# Patient Record
Sex: Male | Born: 1950 | Race: White | Hispanic: No | Marital: Married
Health system: Southern US, Community
[De-identification: ages and names within clinical notes are randomized; demographics above are authoritative.]

---

## 2008-01-26 ENCOUNTER — Encounter: Admission: RE | Admit: 2008-01-26 | Discharge: 2008-01-26 | Payer: Self-pay | Admitting: Orthopedic Surgery

## 2009-06-29 IMAGING — CR DG KNEE 1-2V*R*
2 series · 2 of 2 positions shown · non-contrast
Comparison: None.

CLINICAL DATA: Bilateral knee pain, right greater left for many
years.  No known injury.

RIGHT KNEE - 1-2 VIEW

[view not recorded (1 of 2)]
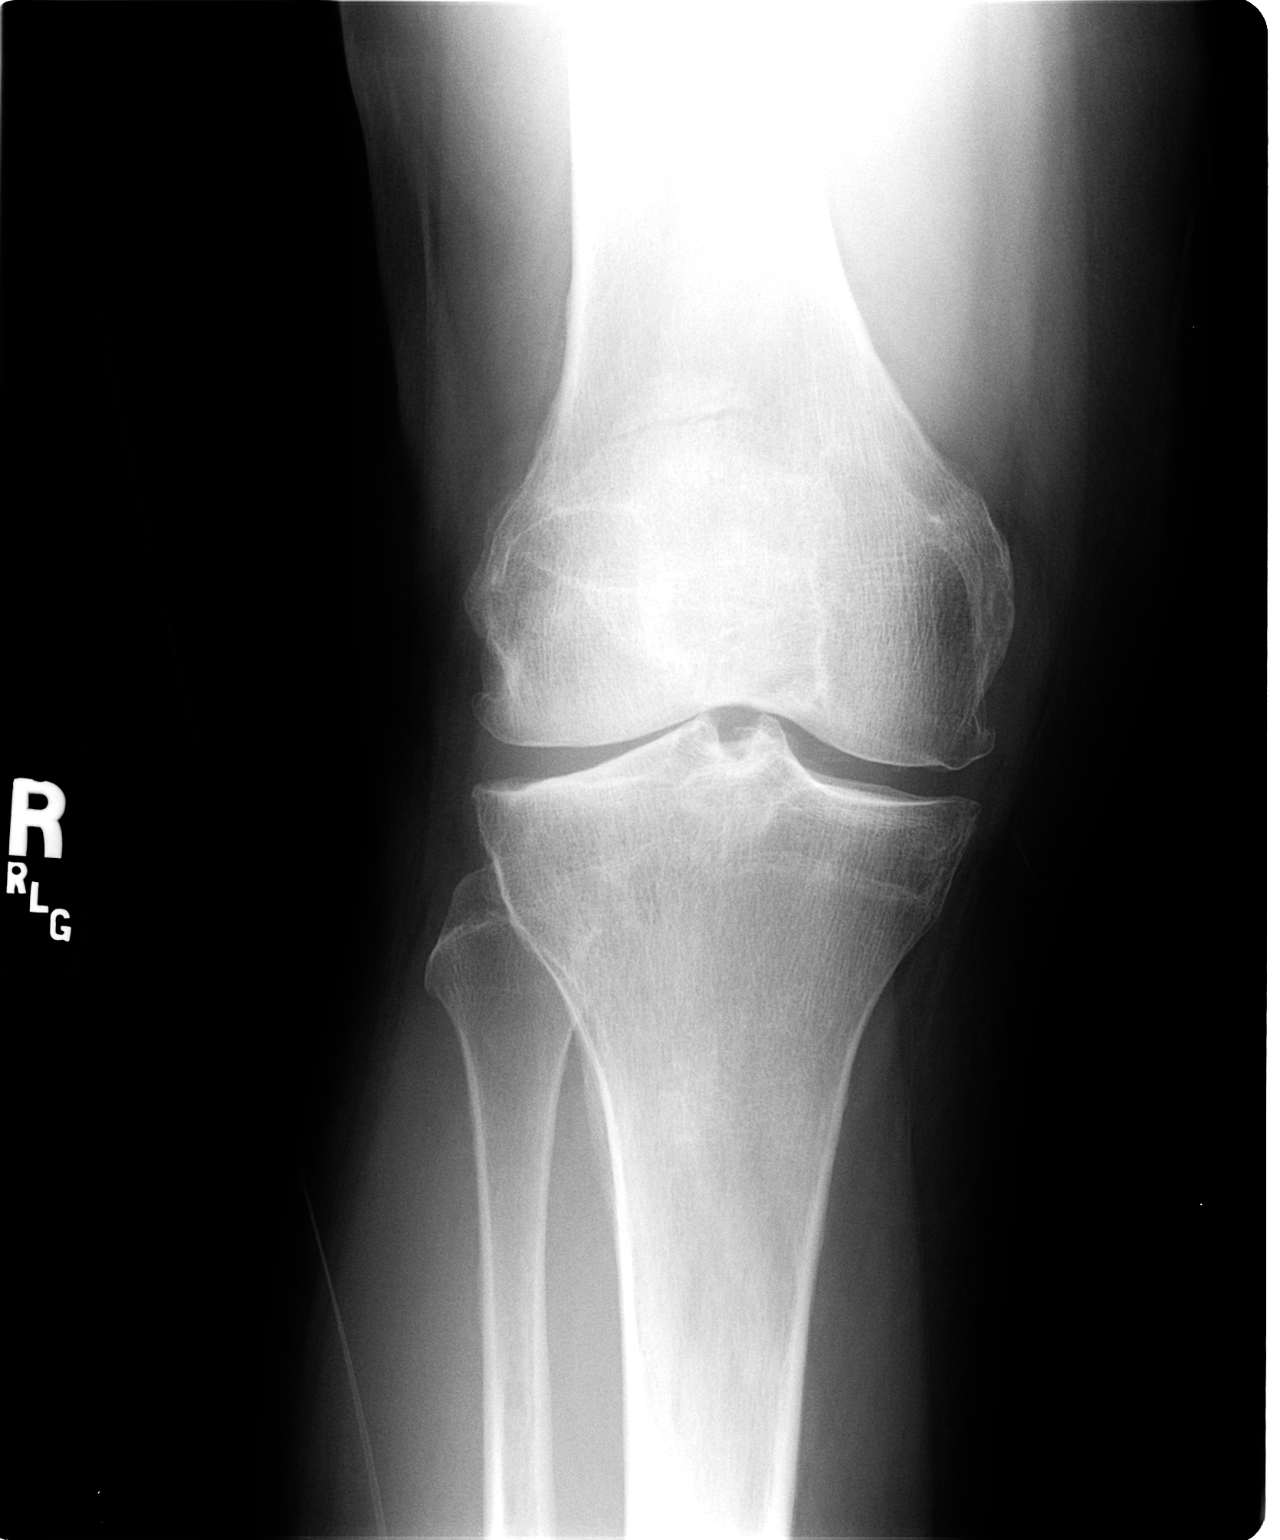

[view not recorded (2 of 2)]
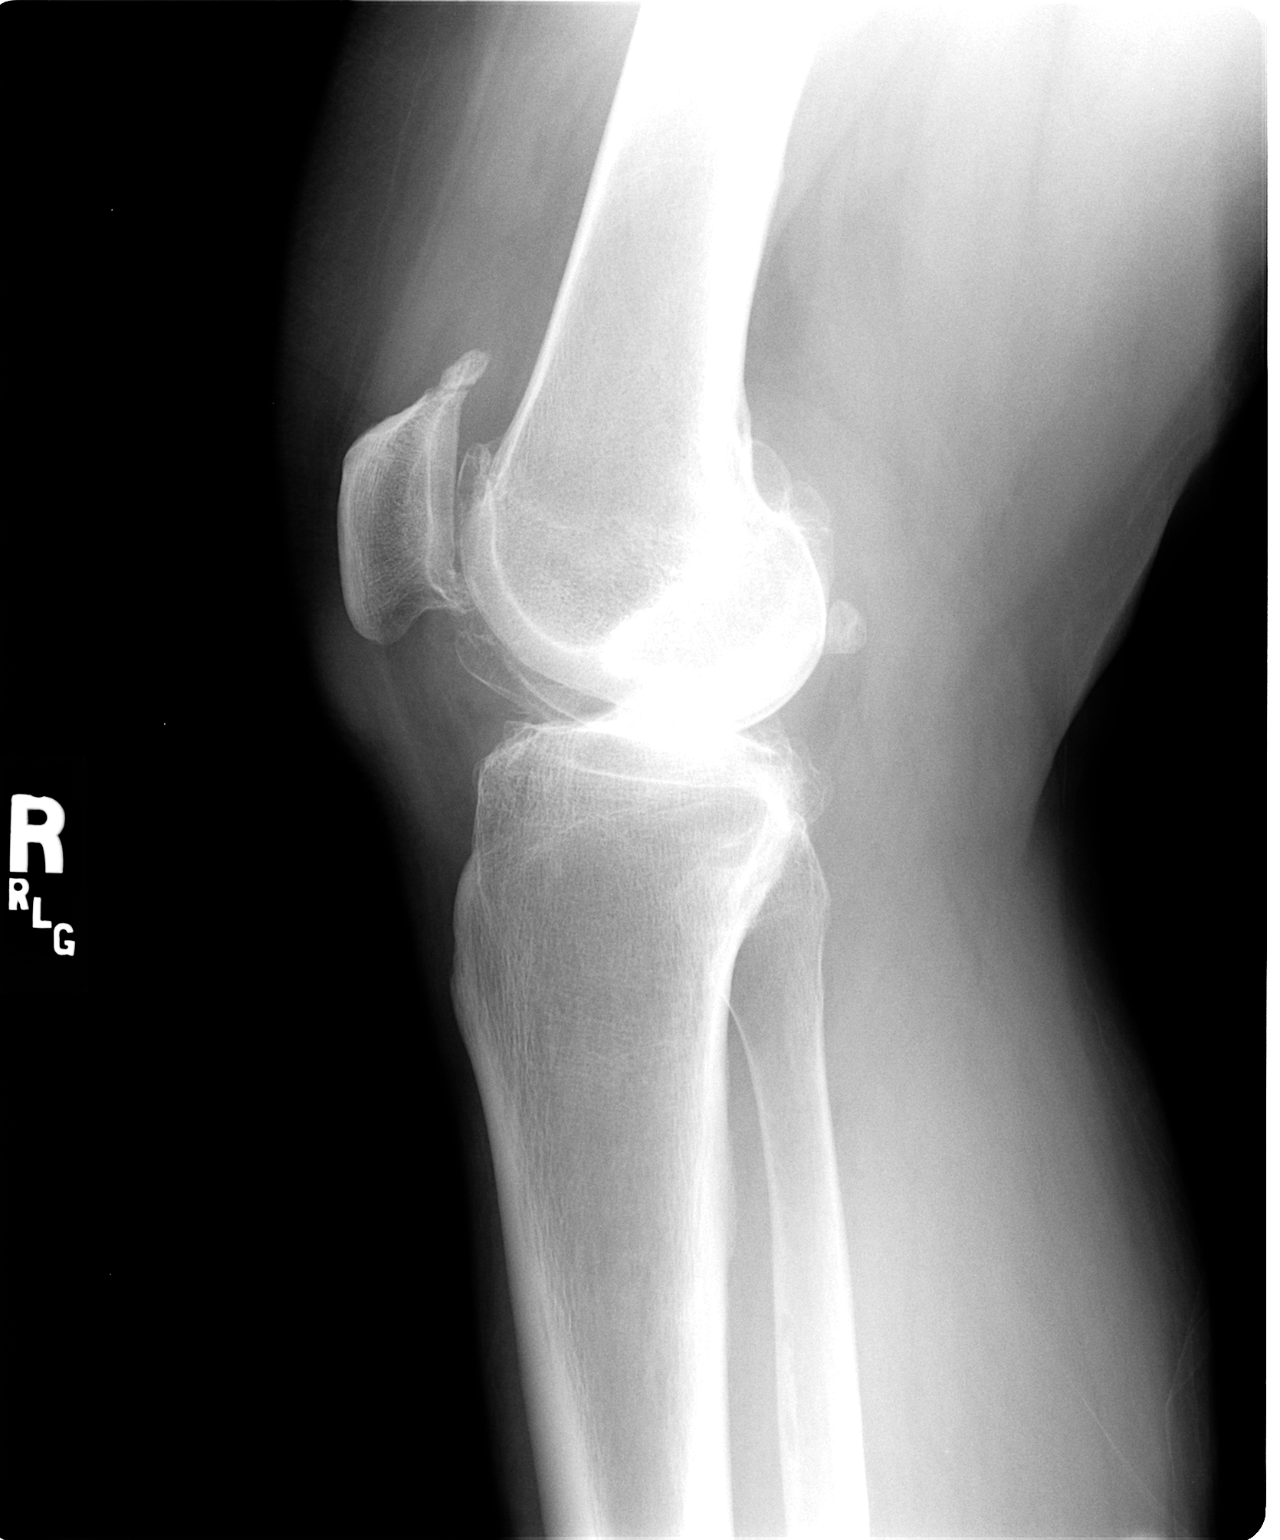

[2 of 2 positions shown; findings below may reference images not displayed]

FINDINGS: Moderate tricompartment degenerative changes.  Question
CPPD.  Osteophyte formation most notable patellofemoral joint
space.  Slight fragmentation of the superior patella osteophyte.
IMPRESSION: Moderate tricompartment degenerative changes.

Question CPPD.

## 2009-06-29 IMAGING — CR DG KNEE 1-2V*L*
2 series · 2 of 2 positions shown · non-contrast
Comparison: none

CLINICAL DATA: Right greater left knee pain for many years.  No
known injury.

LEFT KNEE - 1-2 VIEW
Comparison None.

[view not recorded (1 of 2)]
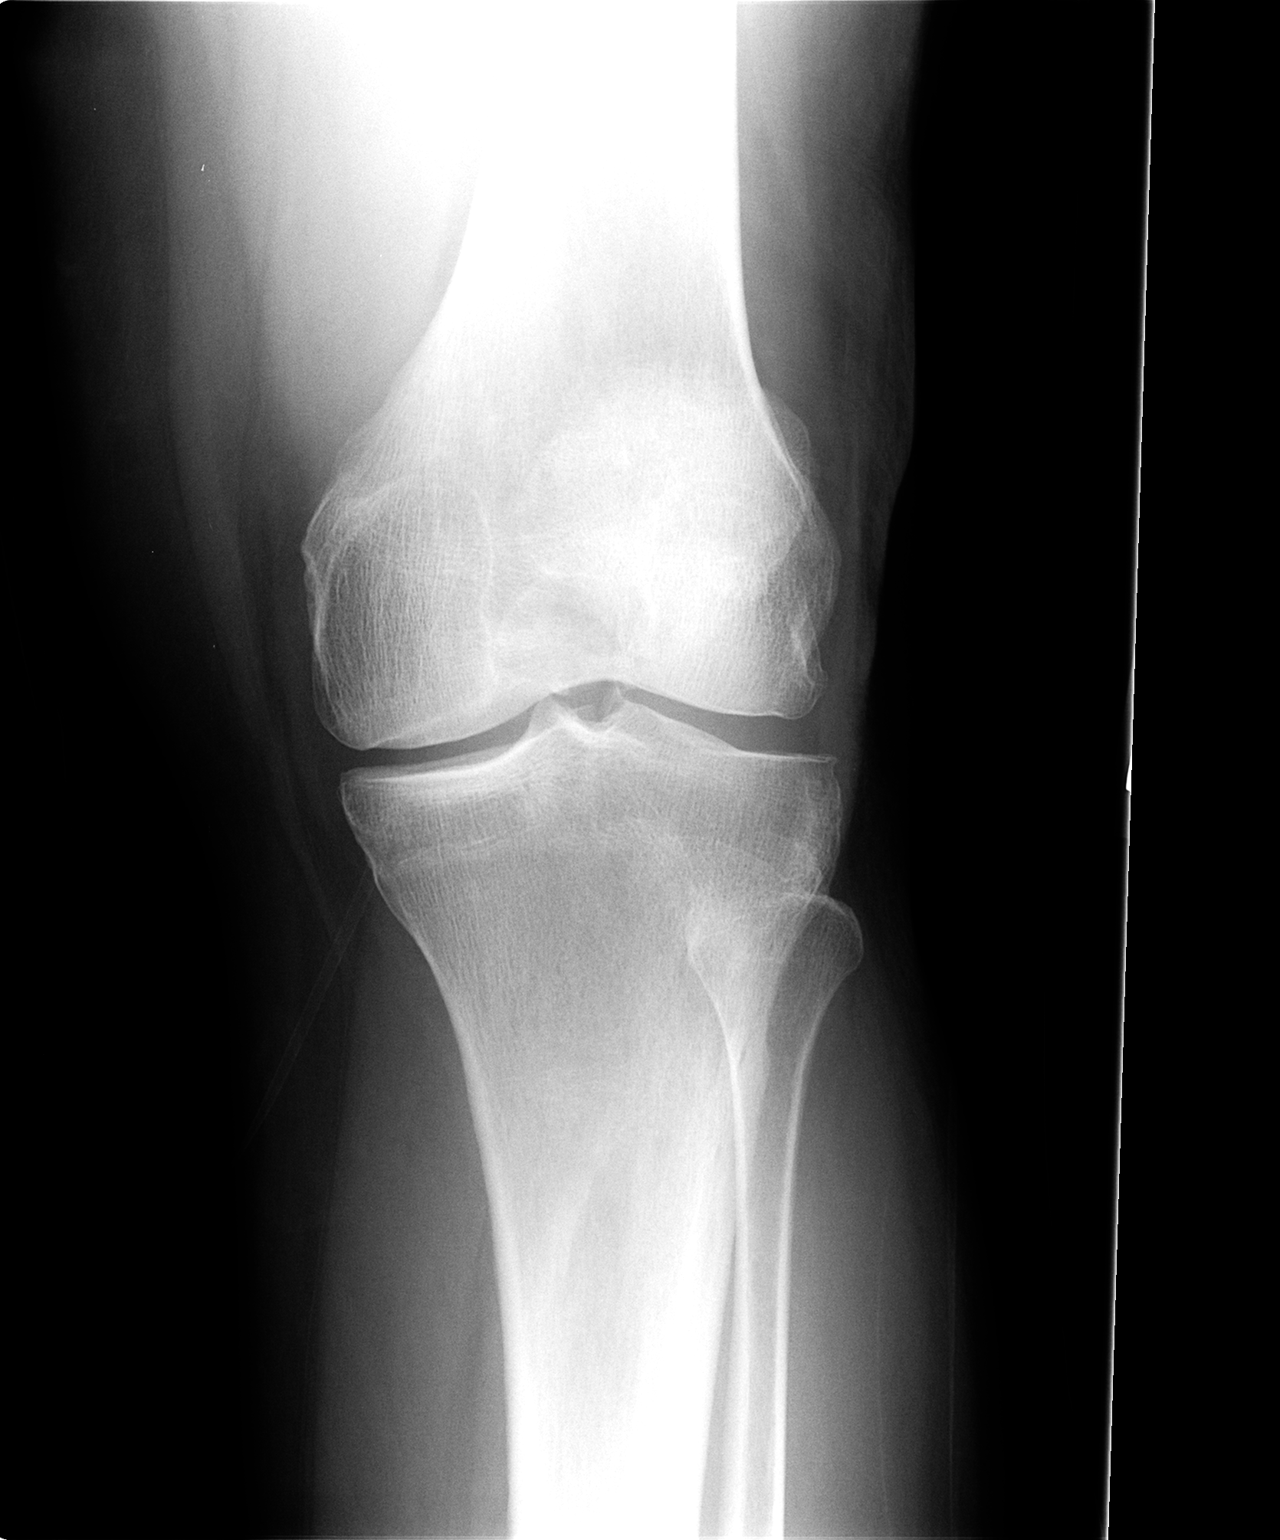

[view not recorded (2 of 2)]
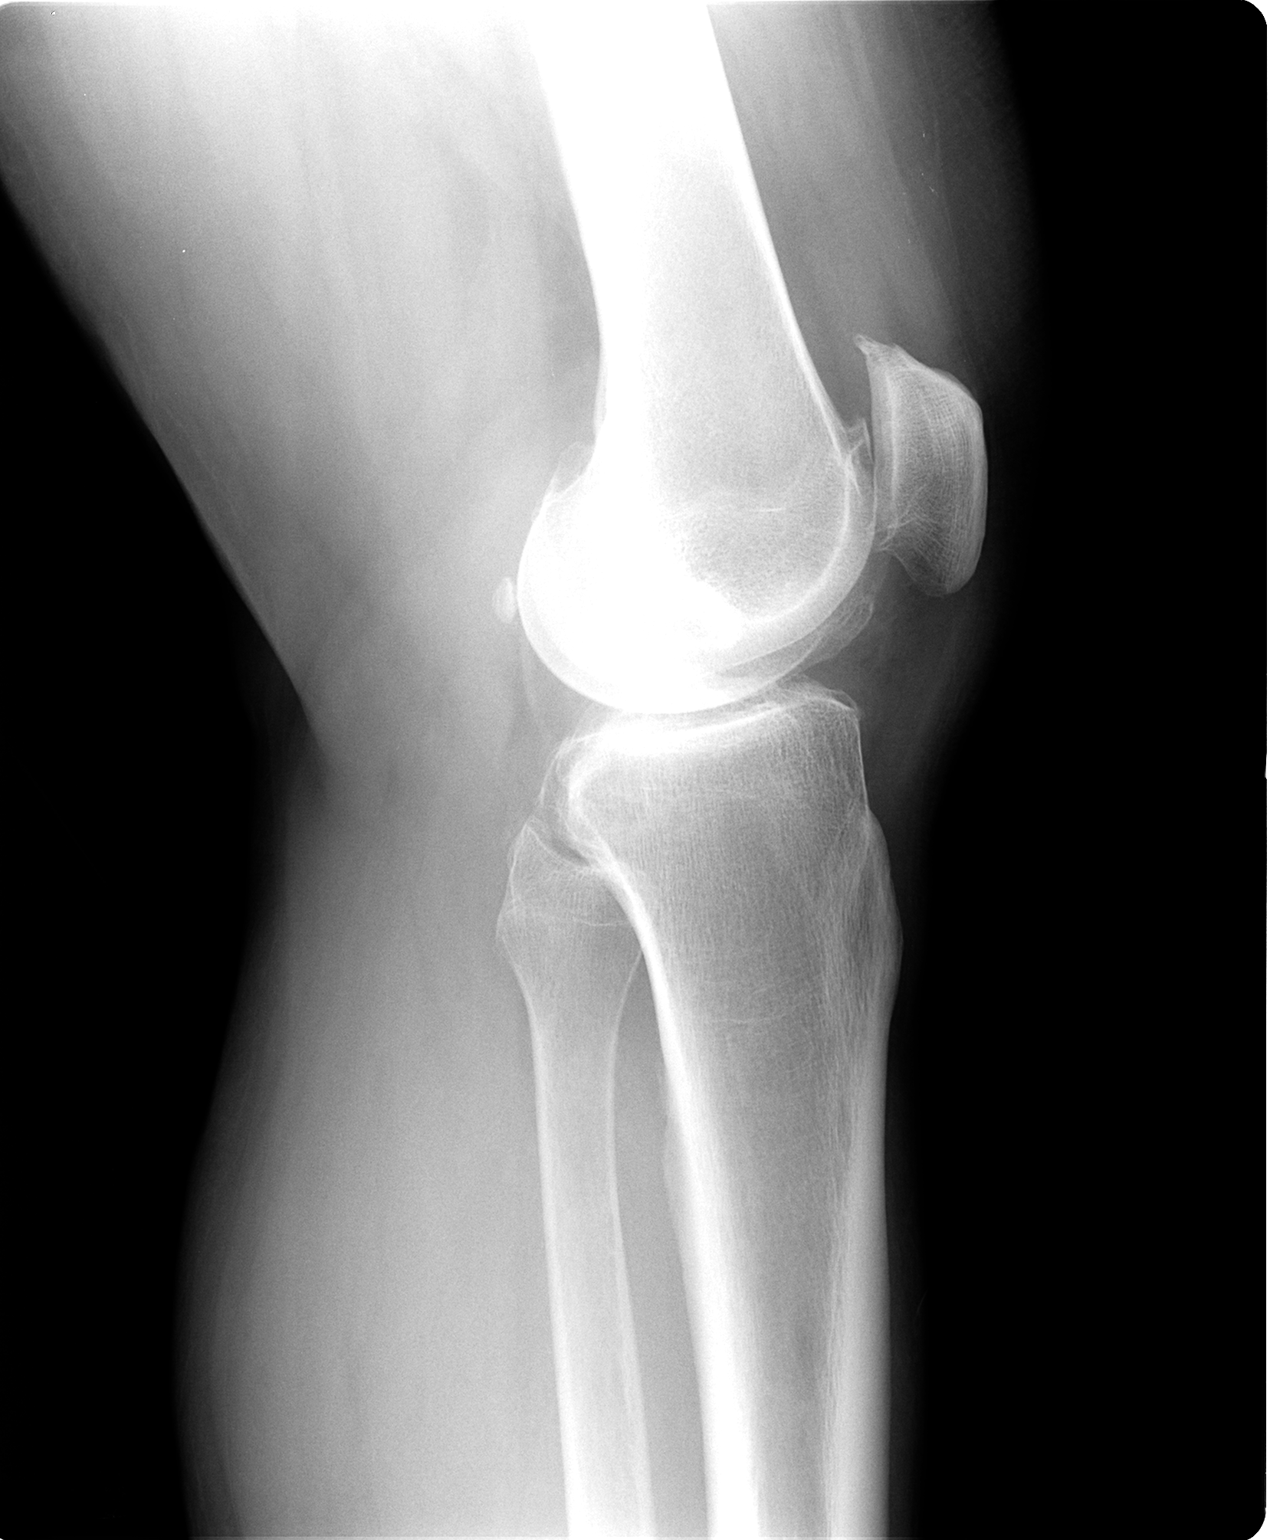

[2 of 2 positions shown; findings below may reference images not displayed]

FINDINGS: Mild to moderate medial tibiofemoral joint space
degenerative changes.  Moderate patellofemoral joint degenerative
changes with osteophyte formation..
IMPRESSION: Moderate patellofemoral joint degenerative changes.

Mild to moderate medial tibial femoral joint space degenerative
changes.

## 2010-12-03 ENCOUNTER — Other Ambulatory Visit: Payer: Self-pay | Admitting: Orthopedic Surgery

## 2010-12-03 DIAGNOSIS — M25561 Pain in right knee: Secondary | ICD-10-CM

## 2010-12-04 ENCOUNTER — Ambulatory Visit
Admission: RE | Admit: 2010-12-04 | Discharge: 2010-12-04 | Disposition: A | Discharge status: Home / Self Care | Payer: PRIVATE HEALTH INSURANCE | Source: Ambulatory Visit | Attending: Orthopedic Surgery | Admitting: Orthopedic Surgery

## 2010-12-04 DIAGNOSIS — M25562 Pain in left knee: Secondary | ICD-10-CM

## 2012-05-07 IMAGING — CR DG KNEE 1-2V*R*
2 series · 2 of 2 positions shown · non-contrast
Comparison: Right knee radiographs 01/26/2008.

CLINICAL DATA: Bilateral knee pain, right greater than left.
Remote arthroscopic surgery.

RIGHT KNEE - 1-2 VIEW

[view not recorded (1 of 2)]
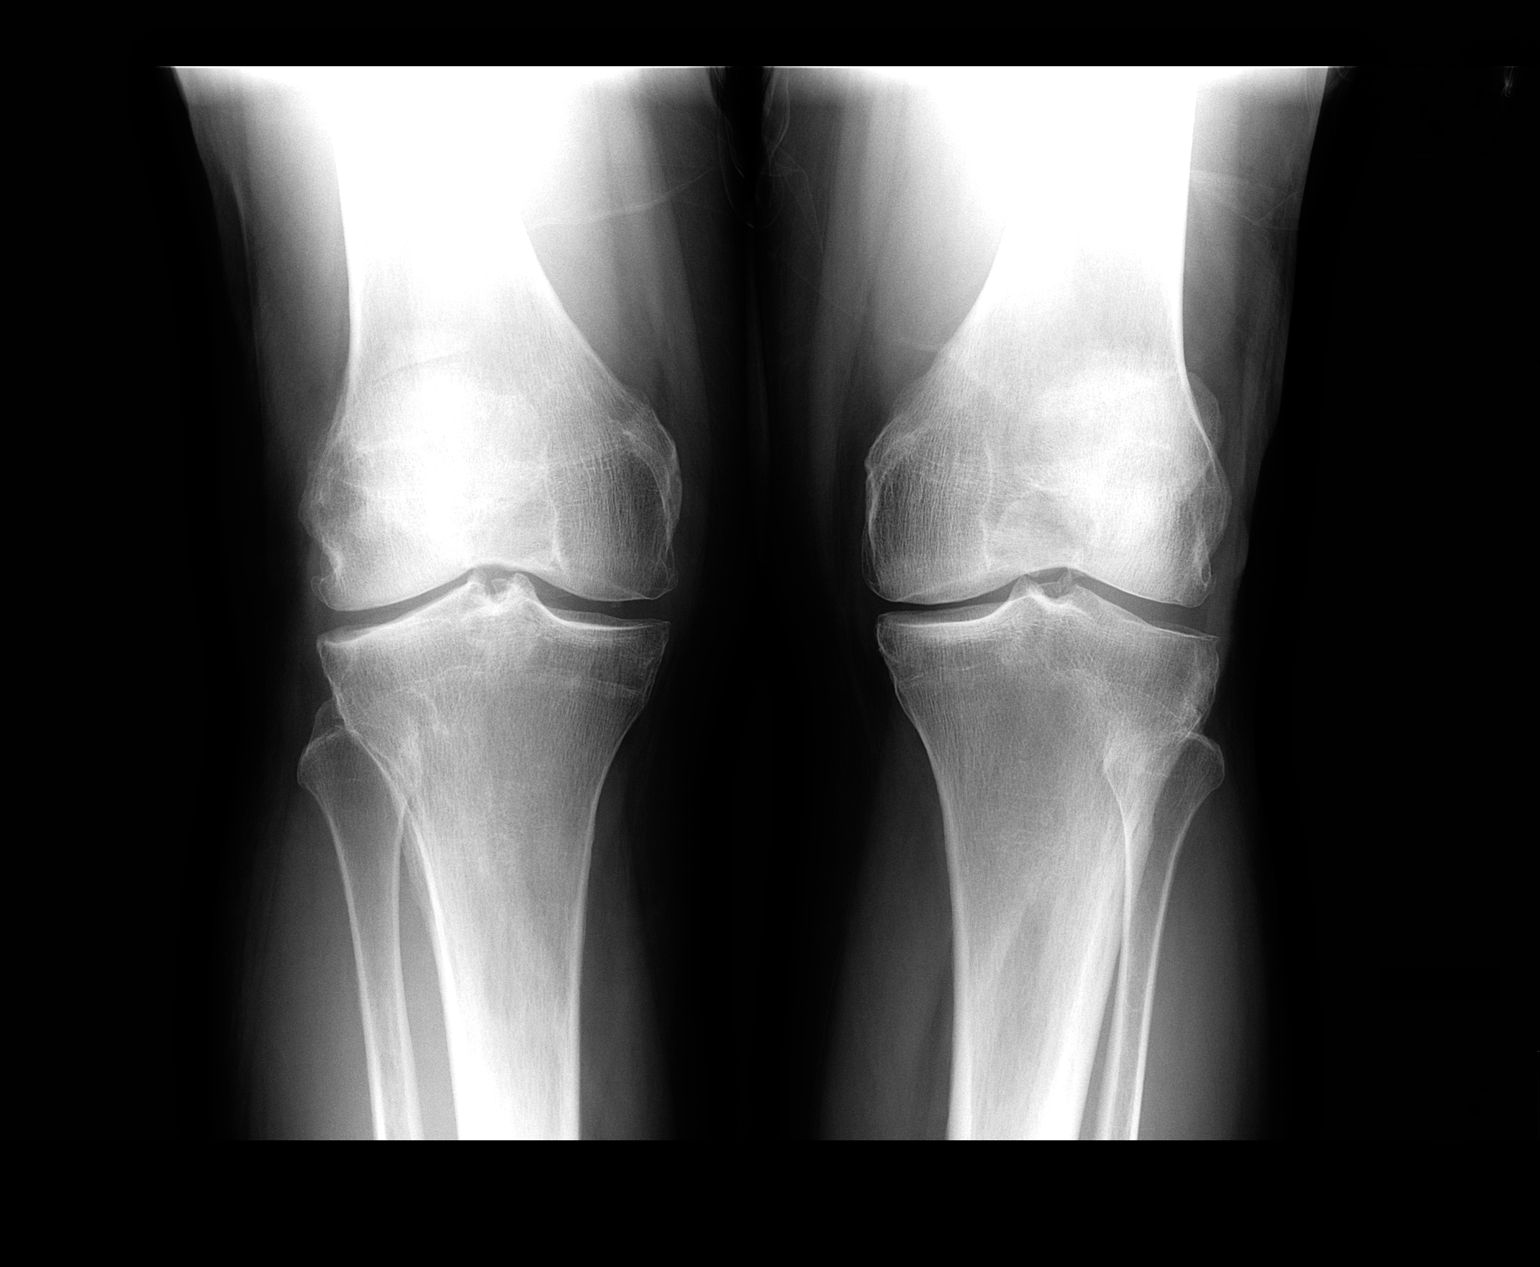

[view not recorded (2 of 2)]
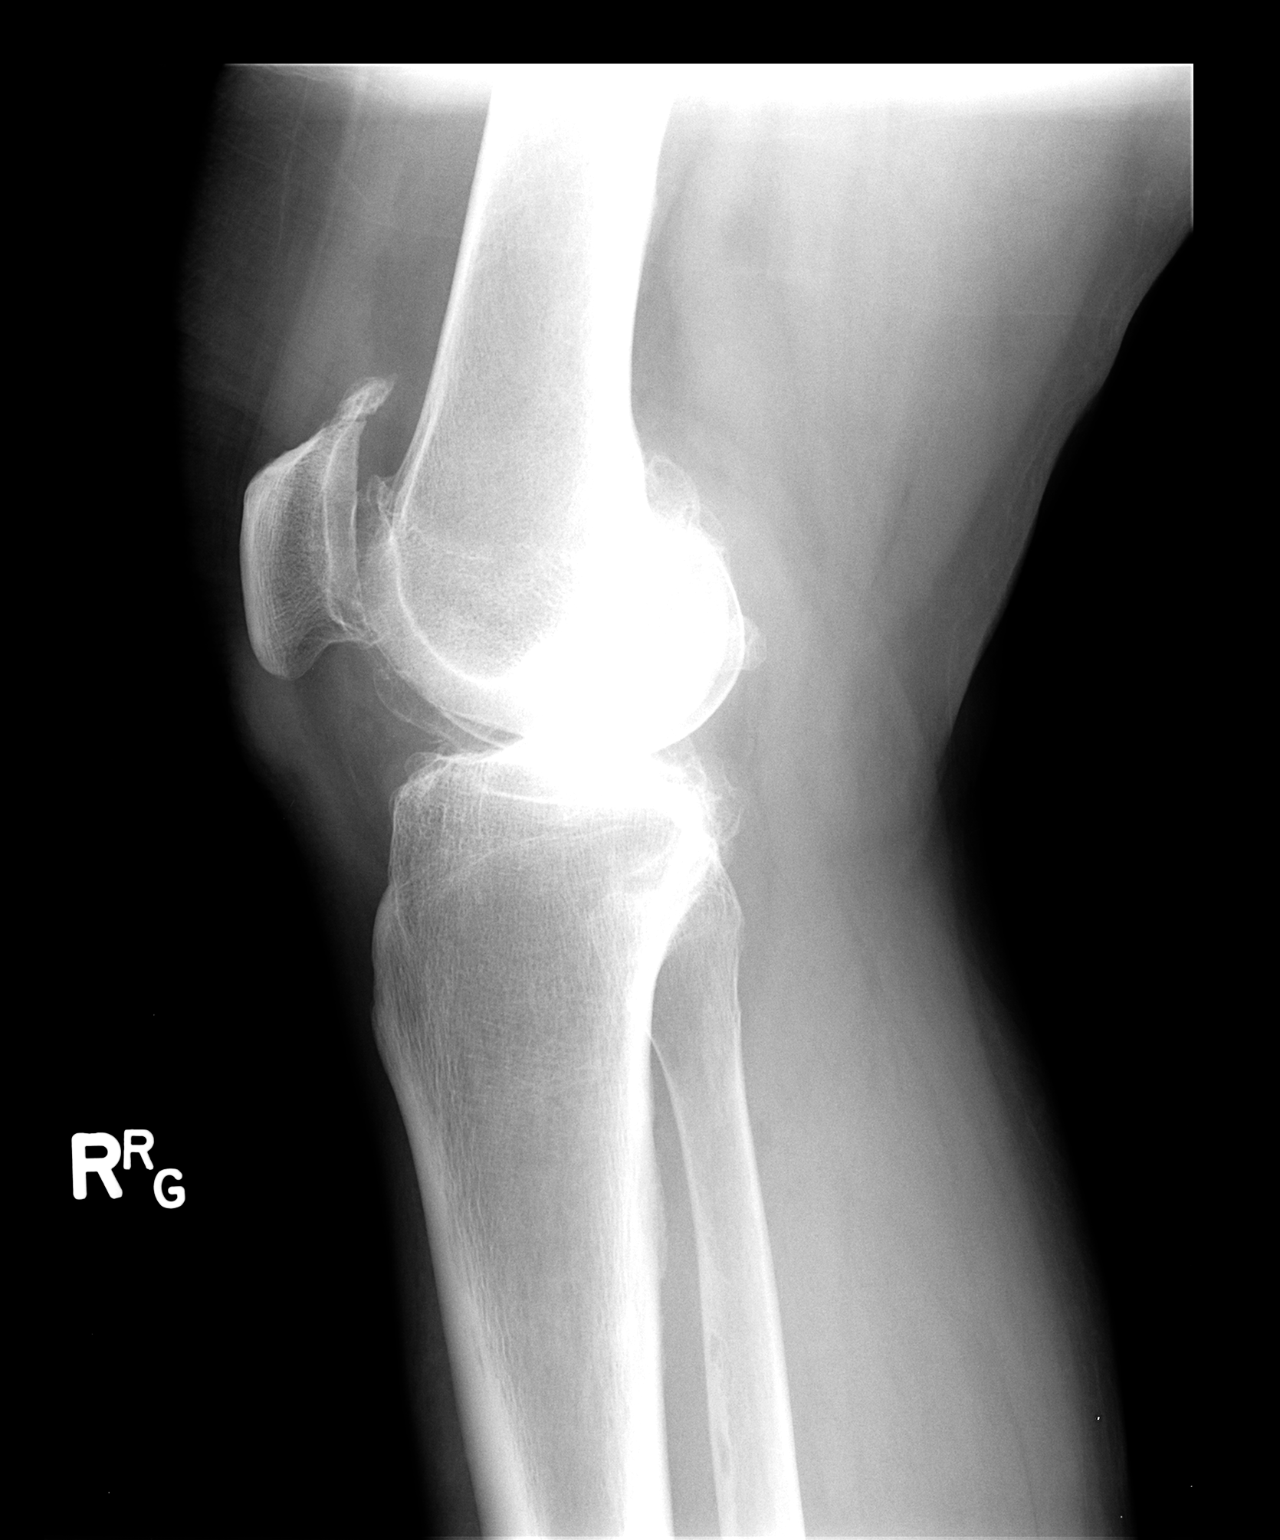

[2 of 2 positions shown; findings below may reference images not displayed]

FINDINGS: Standing AP views of both knees and a lateral view of the
right knee are submitted.  There are moderate tricompartmental
degenerative changes on the right with a fragmented osteophyte
projecting from the superior pole of the patella.  No significantly
progressive joint space loss is identified.  There is no evidence
of fracture or loose body.  On the left, milder medial and lateral
compartment degenerative changes are noted.
IMPRESSION: Stable tricompartmental degenerative changes on the right.  No
evidence of loose body.

## 2019-08-23 ENCOUNTER — Ambulatory Visit: Payer: PRIVATE HEALTH INSURANCE | Attending: Internal Medicine

## 2019-08-23 NOTE — Progress Notes (Signed)
   [facility] Vaccination [facility]  Name:  Salvador Coupe    MRN: [id number] DOB: 1951-07-27  08/23/2019  Mr. Shimel was observed post Covid-19 immunization for 15 minutes without incidence. He was provided with Vaccine Information Sheet and instruction to access the V-Safe system.   Mr. Greenley was instructed to call 911 with any severe reactions post vaccine: Marland Kitchen Difficulty breathing  . Swelling of your face and throat  . A fast heartbeat  . A bad rash all over your body  . Dizziness and weakness

## 2019-09-13 ENCOUNTER — Ambulatory Visit: Payer: PRIVATE HEALTH INSURANCE | Attending: Internal Medicine

## 2019-09-13 DIAGNOSIS — Z23 Encounter for immunization: Secondary | ICD-10-CM | POA: Insufficient documentation

## 2019-09-13 NOTE — Progress Notes (Signed)
   Covid-19 Vaccination Clinic  Name:  Jermaine Roberts    MRN: [id number] DOB: 04-02-1951  09/13/2019  [name] was observed post Covid-19 immunization for 15 minutes without incidence. He was provided with Vaccine Information Sheet and instruction to access the V-Safe system.   [name] was instructed to call 911 with any severe reactions post vaccine: Marland Kitchen Difficulty breathing  . Swelling of your face and throat  . A fast heartbeat  . A bad rash all over your body  . Dizziness and weakness    Immunizations Administered    Name Date Dose VIS Date Route   Pfizer COVID-19 Vaccine 09/13/2019  9:03 AM 0.3 mL 07/13/2019 Intramuscular   Manufacturer: [employer], [employer]   Lot: [id number]   NDC: [id number]
